# Patient Record
Sex: Female | Born: 2000 | Race: Black or African American | Hispanic: No | Marital: Single | Smoking: Never smoker
Health system: Southern US, Community
[De-identification: ages and names within clinical notes are randomized; demographics above are authoritative.]

## PROBLEM LIST (undated history)

## (undated) DIAGNOSIS — K5900 Constipation, unspecified: Secondary | ICD-10-CM

## (undated) DIAGNOSIS — R011 Cardiac murmur, unspecified: Secondary | ICD-10-CM

## (undated) DIAGNOSIS — D649 Anemia, unspecified: Secondary | ICD-10-CM

---
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
## 2021-01-17 IMAGING — CR DG CHEST 2V
1 series · 2 of 2 positions shown · non-contrast
Comparison: None.

CLINICAL DATA: Pt states chest pain and sob for 1X week, denies
cough. No hx of pneumothorax conditions or surgery. Non smoker.

EXAM:
CHEST - 2 VIEW

[Series 1: dg chest 2 view · 0.14mm/px · 2 of 2 slices shown]
[im 1/2]
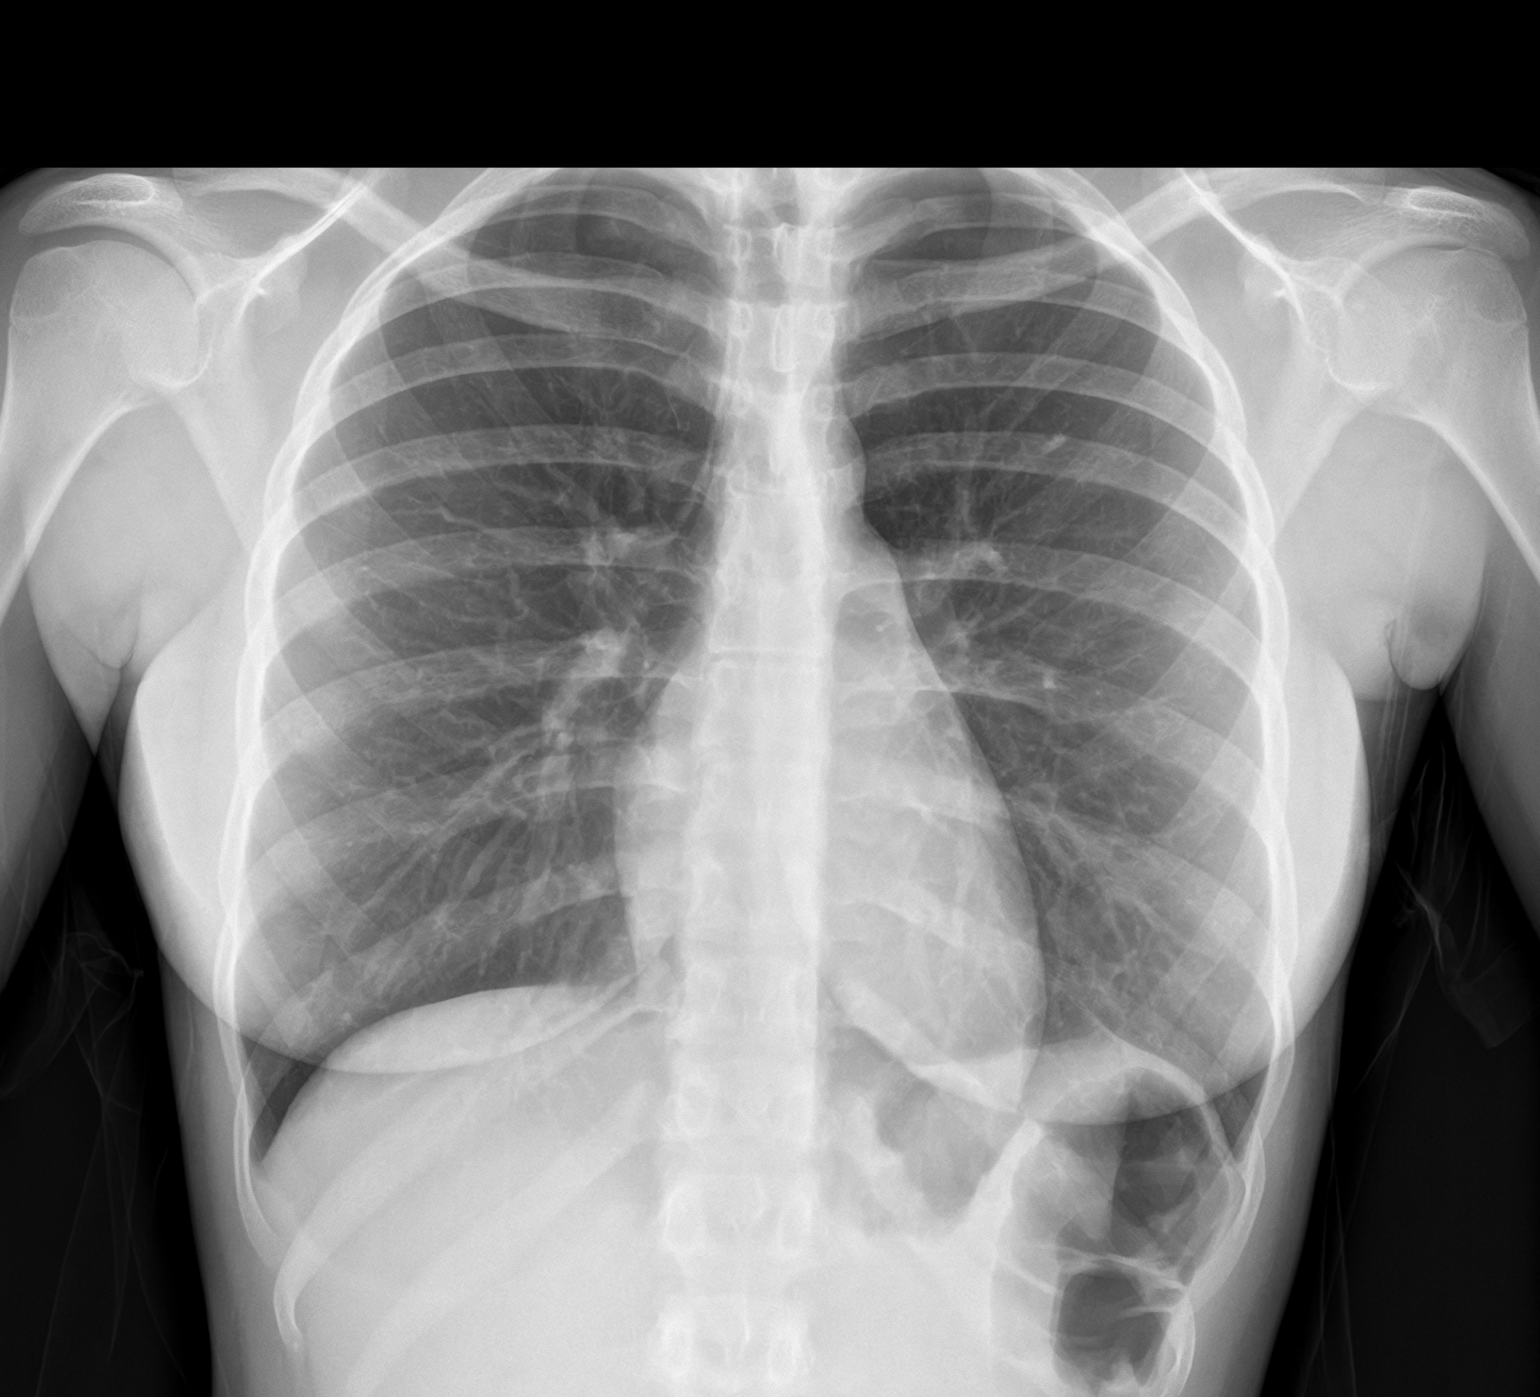
[im 2/2]
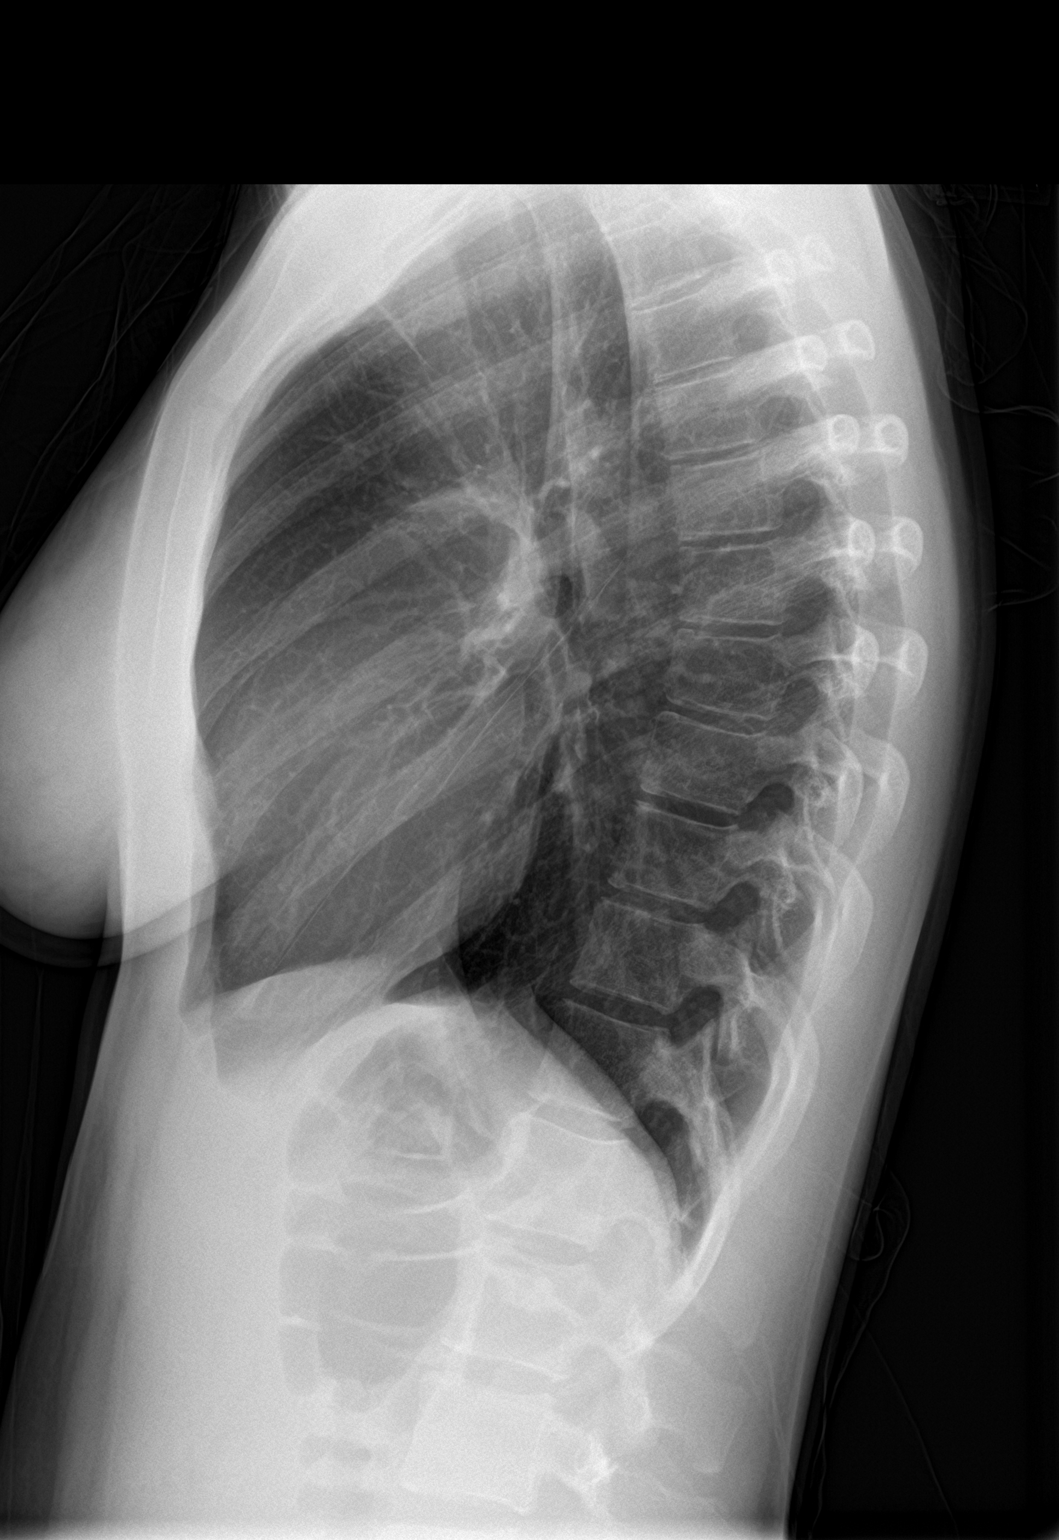

[2 of 2 positions shown; findings below may reference images not displayed]

FINDINGS: Normal heart, mediastinum and hila.

Clear lungs.

No pleural effusion or pneumothorax.

Skeletal structures within normal limits.
IMPRESSION: Normal chest radiographs.

## 2021-07-19 ENCOUNTER — Emergency Department
Admission: EM | Admit: 2021-07-19 | Discharge: 2021-07-19 | Discharge status: Against Medical Advice | Payer: Managed Care, Other (non HMO) | Attending: Emergency Medicine | Admitting: Emergency Medicine

## 2021-07-19 ENCOUNTER — Other Ambulatory Visit: Payer: Self-pay

## 2021-07-19 DIAGNOSIS — F329 Major depressive disorder, single episode, unspecified: Secondary | ICD-10-CM | POA: Insufficient documentation

## 2021-07-19 DIAGNOSIS — Z5321 Procedure and treatment not carried out due to patient leaving prior to being seen by health care provider: Secondary | ICD-10-CM | POA: Diagnosis not present

## 2021-07-19 NOTE — ED Triage Notes (Signed)
Pt presents to ER from home stating "my mind doesn't feel good."  Pt appears very somber in triage, not making contact with nursing staff.  Pt endorses feelings of depression and SI.  Denies HI at this time.  Pt has hx of anxiety, and was restarted on prozac appx 2 weeks ago.  Pt states thoughts of SI just started today.  Pt endorses stress in her life.  Pt otherwise A&O x4 at this time and in NAD.

## 2021-07-19 NOTE — ED Notes (Signed)
Pt and mother in triage room state that they do not want to go through the whole psych evaluation process, such as getting undressed and being in a room in the ER.  Pt states she is feeling sad, and is not really feeling suicidal.  Pt answered "NO" to all suicide screening questions.  Pt and mother state they will go see their PCP in [place] in the AM.  Pt ambulatory to triage at this time.

## 2021-10-23 IMAGING — US US ABDOMEN LIMITED RUQ/ASCITES
1 series · 14 of 25 positions shown · non-contrast
Comparison: None.

CLINICAL DATA: RUQ pain

EXAM:
ULTRASOUND ABDOMEN LIMITED RIGHT UPPER QUADRANT

[Series 1: us abdomen limited ruq (liver/gb) · 14 of 51 slices shown]
[im 1/51]
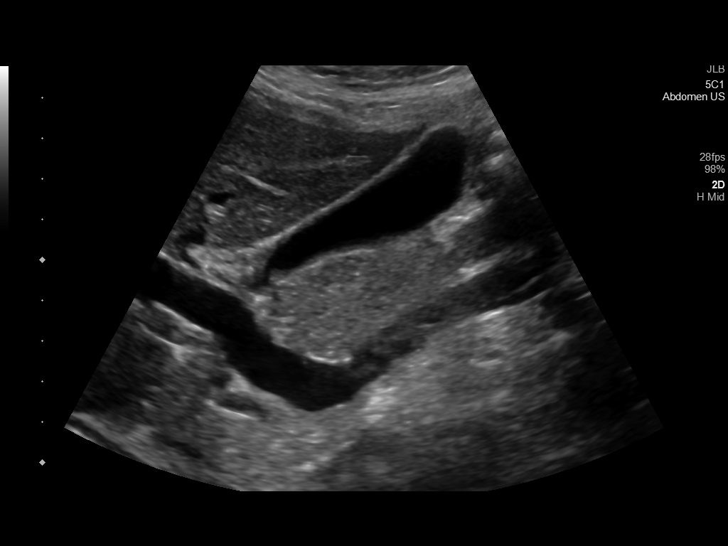
[im 5/51]
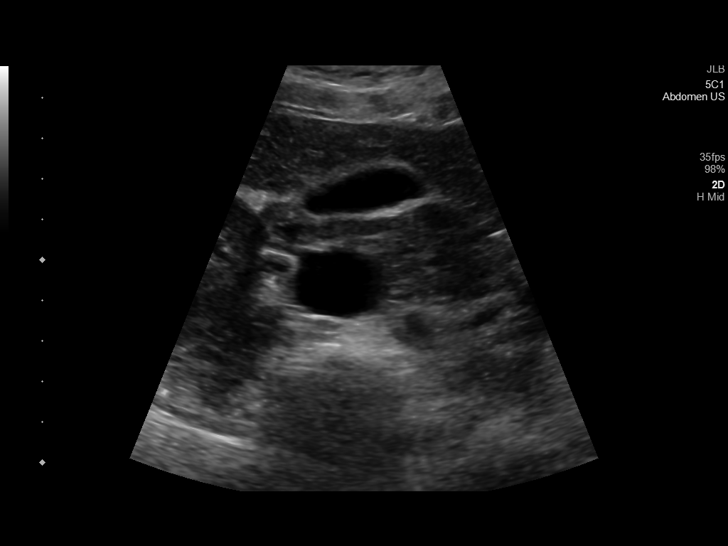
[im 9/51]
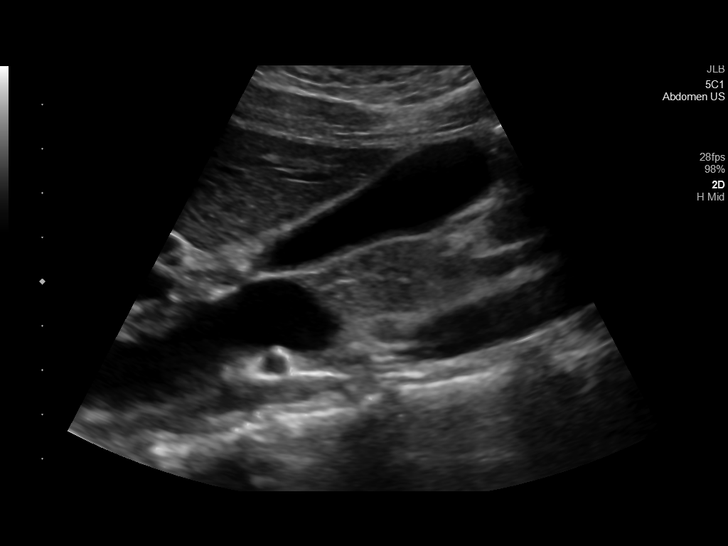
[im 13/51]
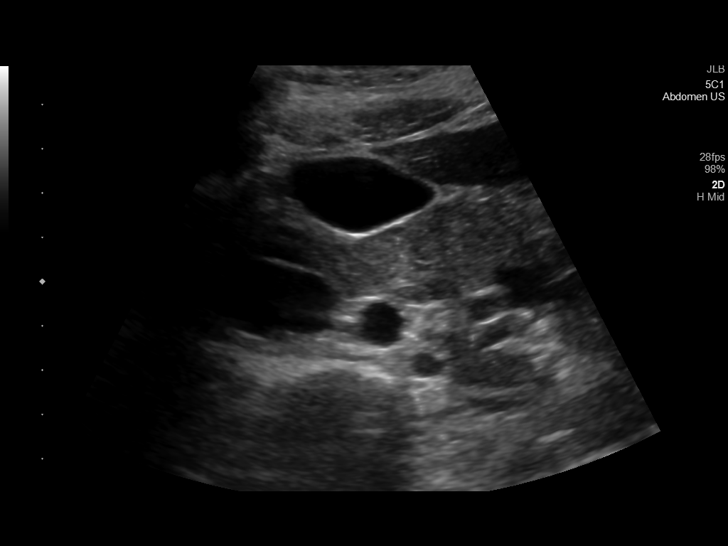
[im 17/51]
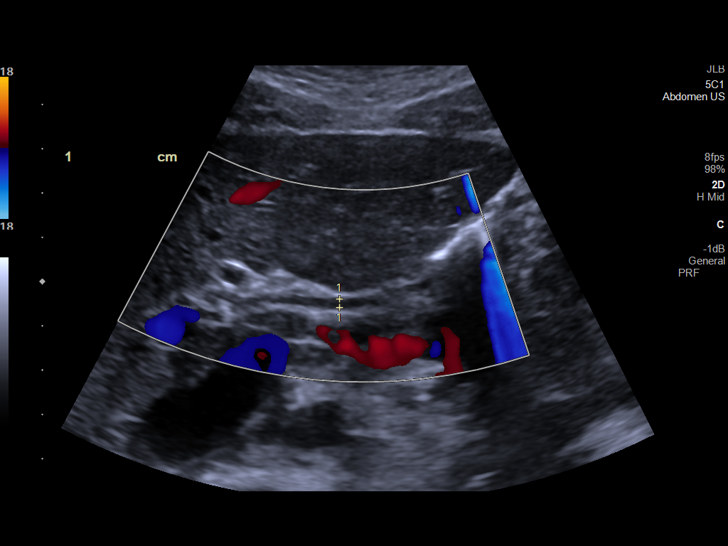
[im 19/51]
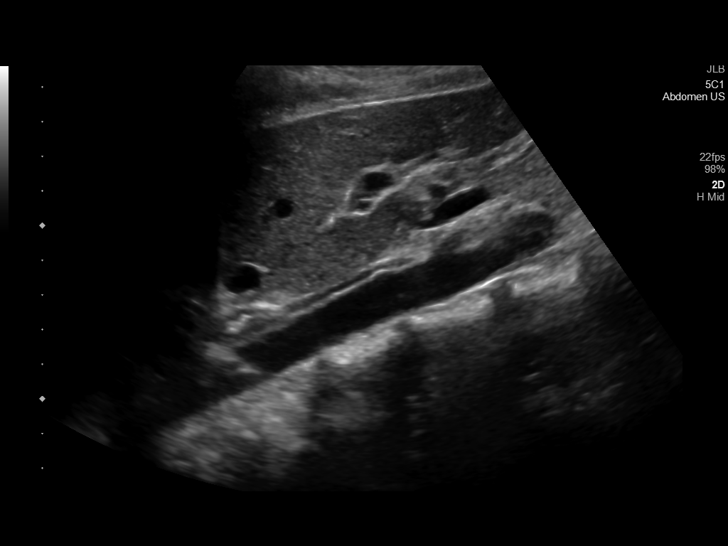
[im 23/51]
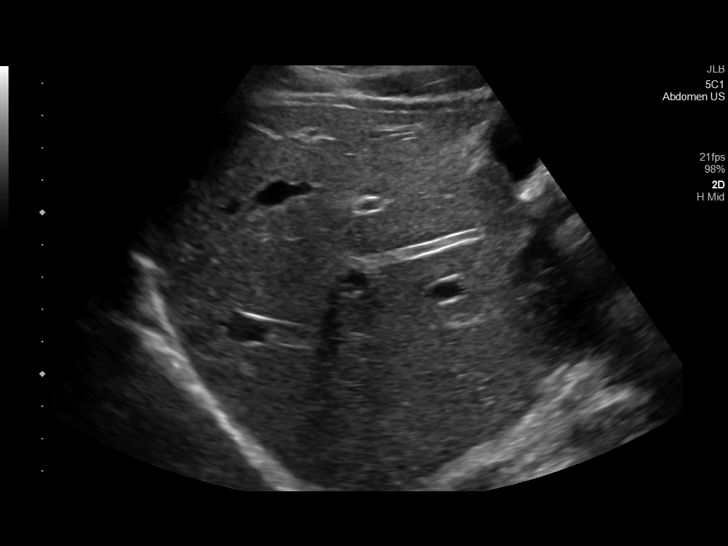
[im 28/51]
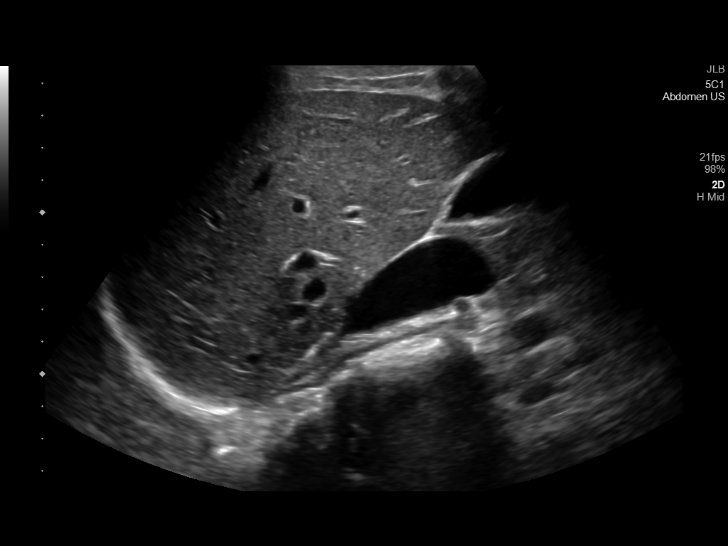
[im 32/51]
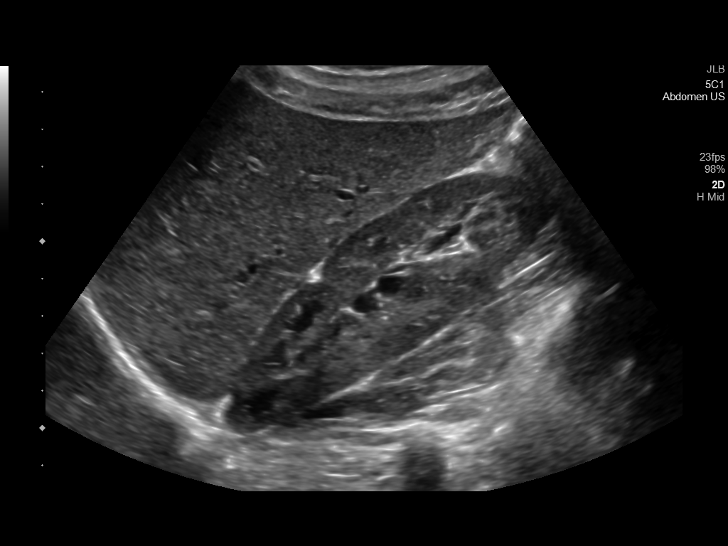
[im 34/51]
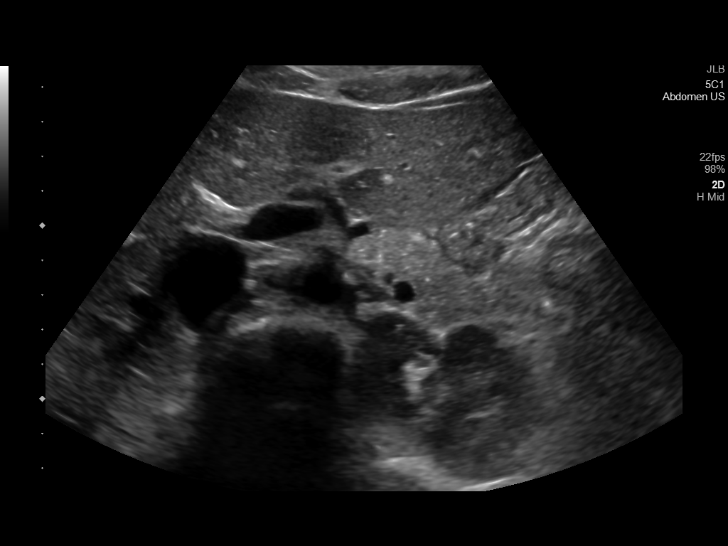
[im 38/51]
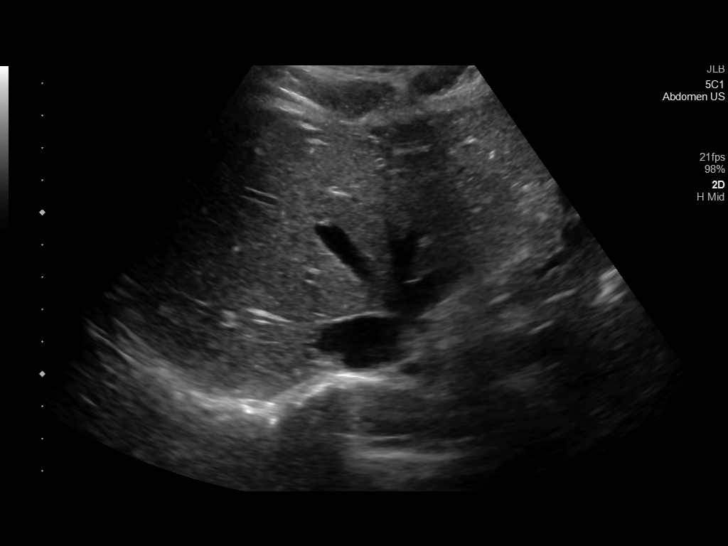
[im 42/51]
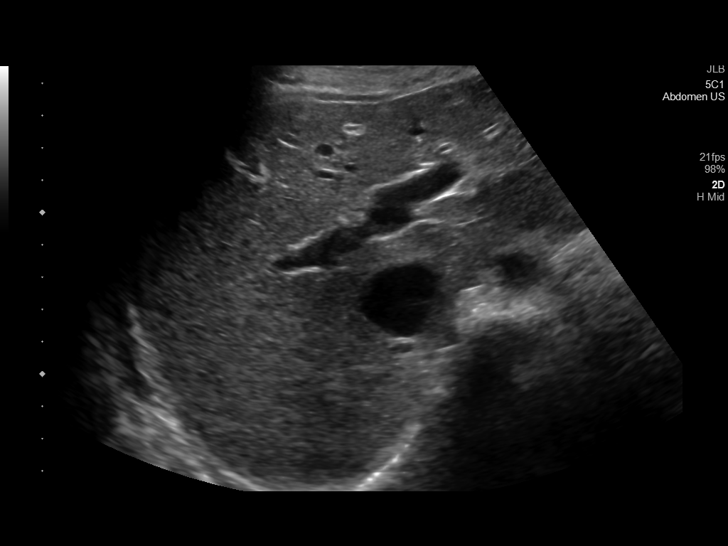
[im 46/51]
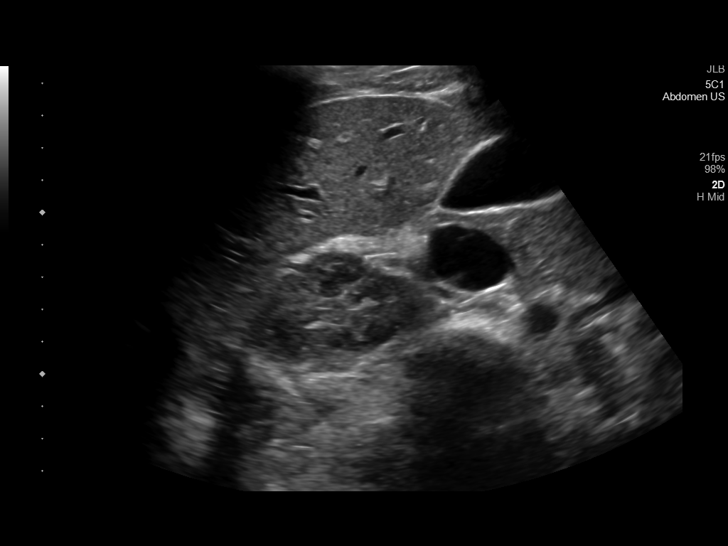
[im 51/51]
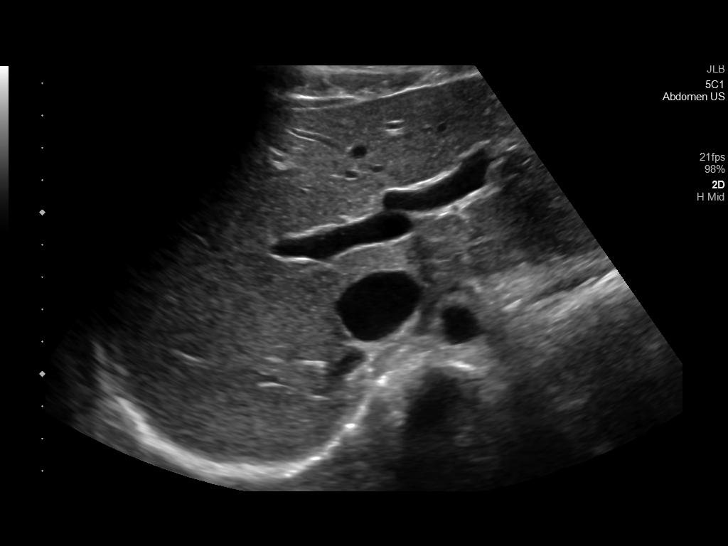

[14 of 25 positions shown; findings below may reference images not displayed]

FINDINGS: Gallbladder:

No gallstones or wall thickening visualized. No sonographic Murphy
sign noted by sonographer.

Common bile duct:

Diameter: 2mm

Liver:

No focal lesion identified. Within normal limits in parenchymal
echogenicity. Portal vein is patent on color Doppler imaging with
normal direction of blood flow towards the liver.

Other: None.
IMPRESSION: Normal examination

## 2022-01-26 ENCOUNTER — Emergency Department
Admission: EM | Admit: 2022-01-26 | Discharge: 2022-01-27 | Discharge status: Against Medical Advice | Payer: Managed Care, Other (non HMO) | Attending: Emergency Medicine | Admitting: Emergency Medicine

## 2022-01-26 DIAGNOSIS — R079 Chest pain, unspecified: Secondary | ICD-10-CM | POA: Diagnosis present

## 2022-01-26 DIAGNOSIS — R1110 Vomiting, unspecified: Secondary | ICD-10-CM | POA: Diagnosis not present

## 2022-01-26 DIAGNOSIS — R531 Weakness: Secondary | ICD-10-CM | POA: Diagnosis not present

## 2022-01-26 DIAGNOSIS — Z5321 Procedure and treatment not carried out due to patient leaving prior to being seen by health care provider: Secondary | ICD-10-CM | POA: Insufficient documentation

## 2022-01-27 DIAGNOSIS — R079 Chest pain, unspecified: Secondary | ICD-10-CM | POA: Diagnosis not present

## 2022-01-27 LAB — CBC
HCT: 34.2 % — ABNORMAL LOW (ref 36.0–46.0)
Hemoglobin: 10.8 g/dL — ABNORMAL LOW (ref 12.0–15.0)
MCH: 29 pg (ref 26.0–34.0)
MCHC: 31.6 g/dL (ref 30.0–36.0)
MCV: 91.9 fL (ref 80.0–100.0)
Platelets: 207 10*3/uL (ref 150–400)
RBC: 3.72 MIL/uL — ABNORMAL LOW (ref 3.87–5.11)
RDW: 14.2 % (ref 11.5–15.5)
WBC: 4.1 10*3/uL (ref 4.0–10.5)
nRBC: 0 % (ref 0.0–0.2)

## 2022-01-27 LAB — COMPREHENSIVE METABOLIC PANEL
ALT: 13 U/L (ref 0–44)
AST: 18 U/L (ref 15–41)
Albumin: 4 g/dL (ref 3.5–5.0)
Alkaline Phosphatase: 69 U/L (ref 38–126)
Anion gap: 5 (ref 5–15)
BUN: 10 mg/dL (ref 6–20)
CO2: 25 mmol/L (ref 22–32)
Calcium: 9 mg/dL (ref 8.9–10.3)
Chloride: 109 mmol/L (ref 98–111)
Creatinine, Ser: 0.74 mg/dL (ref 0.44–1.00)
GFR, Estimated: >60 mL/min (ref >60)
Glucose, Bld: 91 mg/dL (ref 70–99)
Potassium: 3.4 mmol/L — ABNORMAL LOW (ref 3.5–5.1)
Sodium: 139 mmol/L (ref 135–145)
Total Bilirubin: 0.9 mg/dL (ref 0.3–1.2)
Total Protein: 7.3 g/dL (ref 6.5–8.1)

## 2022-01-27 LAB — TROPONIN I (HIGH SENSITIVITY): Troponin I (High Sensitivity): <2 ng/L (ref <18)

## 2022-01-27 LAB — HCG, QUANTITATIVE, PREGNANCY: hCG, Beta Chain, Quant, S: <1 m[IU]/mL (ref <5)

## 2022-01-27 NOTE — ED Triage Notes (Signed)
Pt reports CP, weakness and vomiting, that started today. Denies SOB, diarrhea, or cold like symptoms. Afebrile, VSS, GCS 15. Mother reports has not eaten today, and is anemic, takes iron supplements.

## 2022-08-17 ENCOUNTER — Other Ambulatory Visit: Payer: Self-pay

## 2022-08-17 ENCOUNTER — Emergency Department: Payer: Managed Care, Other (non HMO)

## 2022-08-17 ENCOUNTER — Emergency Department
Admission: EM | Admit: 2022-08-17 | Discharge: 2022-08-17 | Disposition: A | Discharge status: Home / Self Care | Payer: Managed Care, Other (non HMO) | Attending: Student in an Organized Health Care Education/Training Program | Admitting: Student in an Organized Health Care Education/Training Program

## 2022-08-17 DIAGNOSIS — R079 Chest pain, unspecified: Secondary | ICD-10-CM | POA: Diagnosis present

## 2022-08-17 DIAGNOSIS — F439 Reaction to severe stress, unspecified: Secondary | ICD-10-CM

## 2022-08-17 DIAGNOSIS — F430 Acute stress reaction: Secondary | ICD-10-CM | POA: Insufficient documentation

## 2022-08-17 LAB — BASIC METABOLIC PANEL
Anion gap: 6 (ref 5–15)
BUN: 10 mg/dL (ref 6–20)
CO2: 25 mmol/L (ref 22–32)
Calcium: 9.5 mg/dL (ref 8.9–10.3)
Chloride: 107 mmol/L (ref 98–111)
Creatinine, Ser: 0.71 mg/dL (ref 0.44–1.00)
GFR, Estimated: >60 mL/min (ref >60)
Glucose, Bld: 84 mg/dL (ref 70–99)
Potassium: 3.6 mmol/L (ref 3.5–5.1)
Sodium: 138 mmol/L (ref 135–145)

## 2022-08-17 LAB — CBC
HCT: 33.2 % — ABNORMAL LOW (ref 36.0–46.0)
Hemoglobin: 10.8 g/dL — ABNORMAL LOW (ref 12.0–15.0)
MCH: 30.9 pg (ref 26.0–34.0)
MCHC: 32.5 g/dL (ref 30.0–36.0)
MCV: 94.9 fL (ref 80.0–100.0)
Platelets: 227 10*3/uL (ref 150–400)
RBC: 3.5 MIL/uL — ABNORMAL LOW (ref 3.87–5.11)
RDW: 13.9 % (ref 11.5–15.5)
WBC: 5.1 10*3/uL (ref 4.0–10.5)
nRBC: 0 % (ref 0.0–0.2)

## 2022-08-17 LAB — POC URINE PREG, ED: Preg Test, Ur: NEGATIVE

## 2022-08-17 LAB — TROPONIN I (HIGH SENSITIVITY): Troponin I (High Sensitivity): <2 ng/L (ref <18)

## 2022-08-17 MED ORDER — DIAZEPAM 5 MG PO TABS
5.0000 mg | ORAL_TABLET | Freq: Once | ORAL | Status: AC
  Administered 2022-08-17: 5 mg via ORAL
  Filled 2022-08-17: qty 1

## 2022-08-17 NOTE — ED Triage Notes (Addendum)
Pt to ED for chest pain for a couple months. States could not sleep last night. +shob with exertion Hx anemia

## 2022-08-17 NOTE — ED Provider Notes (Signed)
[facility] Provider Note    Event Date/Time   First MD Initiated Contact with Patient 08/17/22 1023     (approximate)   History   No chief complaint on file.   HPI  Judy Riddle is a 22 y.o. female with a history of anxiety presents to the ER for evaluation of chest pain shortness of breath started last night.  Has had somewhat similar episodes in the past but this 1 does feel different.  No fevers no cough.  She does vape.     Physical Exam   Triage Vital Signs: ED Triage Vitals  Enc Vitals Group     BP 08/17/22 1019 121/74     Pulse Rate 08/17/22 1019 70     Resp 08/17/22 1019 18     Temp 08/17/22 1019 98.1 F (36.7 C)     Temp src --      SpO2 08/17/22 1019 100 %     Weight 08/17/22 1020 108 lb (49 kg)     Height 08/17/22 1020 5\' 2"  (1.575 m)     Head Circumference --      Peak Flow --      Pain Score 08/17/22 1020 8     Pain Loc --      Pain Edu? --      Excl. in GC? --     Most recent vital signs: Vitals:   08/17/22 1019  BP: 121/74  Pulse: 70  Resp: 18  Temp: 98.1 F (36.7 C)  SpO2: 100%     Constitutional: Alert  Eyes: Conjunctivae are normal.  Head: Atraumatic. Nose: No congestion/rhinnorhea. Mouth/Throat: Mucous membranes are moist.   Neck: Painless ROM.  Cardiovascular:   Good peripheral circulation. Respiratory: Normal respiratory effort.  No retractions.  Gastrointestinal: Soft and nontender.  Musculoskeletal:  no deformity Neurologic:  MAE spontaneously. No gross focal neurologic deficits are appreciated.  Skin:  Skin is warm, dry and intact. No rash noted. Psychiatric: Mood and affect are anxious Speech and behavior are normal.    ED Results / Procedures / Treatments   Labs (all labs ordered are listed, but only abnormal results are displayed) Labs Reviewed  CBC - Abnormal; Notable for the following components:      Result Value   RBC 3.50 (*)    Hemoglobin 10.8 (*)    HCT 33.2 (*)    All other  components within normal limits  BASIC METABOLIC PANEL  POC URINE PREG, ED  TROPONIN I (HIGH SENSITIVITY)     EKG  ED ECG REPORT I, Willy Eddy, the attending physician, personally viewed and interpreted this ECG.   Date: 08/17/2022  EKG Time: 10:22  Rate: 65  Rhythm: sinus  Axis: normal  Intervals: normal  ST&T Change: no stemi,    RADIOLOGY Please see ED Course for my review and interpretation.  I personally reviewed all radiographic images ordered to evaluate for the above acute complaints and reviewed radiology reports and findings.  These findings were personally discussed with the patient.  Please see medical record for radiology report.    PROCEDURES:  Critical Care performed: No  Procedures   MEDICATIONS ORDERED IN ED: Medications  diazepam (VALIUM) tablet 5 mg (5 mg Oral Given 08/17/22 1050)     IMPRESSION / MDM / ASSESSMENT AND PLAN / ED COURSE  I reviewed the triage vital signs and the nursing notes.  Differential diagnosis includes, but is not limited to, ACS, pericarditis, esophagitis, boerhaaves, pe, dissection, pna, bronchitis, costochondritis  Patient presenting to the ER for evaluation of symptoms as described above.  Based on symptoms, risk factors and considered above differential, this presenting complaint could reflect a potentially life-threatening illness therefore the patient will be placed on continuous pulse oximetry and telemetry for monitoring.  Laboratory evaluation will be sent to evaluate for the above complaints.  She is low risk by Wells criteria she is PERC negative.  She is satting well on room air vital signs are stable.  Suspect anxiety or stress reaction.  Patient agrees to receive Valium to see if this makes her feel better.   Clinical Course as of 08/17/22 1134  Sun Aug 17, 2022  1053 Chest x-ray on my review and interpretation without evidence of consolidation or infiltrate. [PR]  1133  Patient reassessed.  Feels significantly improved.  Her troponin is less than 2.  This does seem most consistent with stress reaction.  She does appear stable and appropriate for outpatient follow-up. [PR]    Clinical Course User Index [PR] Willy Eddy, MD     FINAL CLINICAL IMPRESSION(S) / ED DIAGNOSES   Final diagnoses:  Stress     Rx / DC Orders   ED Discharge Orders     None        Note:  This document was prepared using Dragon voice recognition software and may include unintentional dictation errors.    Willy Eddy, MD 08/17/22 1134

## 2022-11-27 ENCOUNTER — Emergency Department
Admission: EM | Admit: 2022-11-27 | Discharge: 2022-11-27 | Disposition: A | Discharge status: Home / Self Care | Payer: Managed Care, Other (non HMO) | Attending: Emergency Medicine | Admitting: Emergency Medicine

## 2022-11-27 ENCOUNTER — Emergency Department: Payer: Managed Care, Other (non HMO)

## 2022-11-27 ENCOUNTER — Other Ambulatory Visit: Payer: Self-pay

## 2022-11-27 ENCOUNTER — Encounter: Payer: Self-pay | Admitting: Emergency Medicine

## 2022-11-27 DIAGNOSIS — G43809 Other migraine, not intractable, without status migrainosus: Secondary | ICD-10-CM | POA: Diagnosis not present

## 2022-11-27 DIAGNOSIS — R519 Headache, unspecified: Secondary | ICD-10-CM | POA: Diagnosis present

## 2022-11-27 MED ORDER — PROCHLORPERAZINE EDISYLATE 10 MG/2ML IJ SOLN
10.0000 mg | Freq: Once | INTRAMUSCULAR | Status: AC
  Administered 2022-11-27: 10 mg via INTRAVENOUS
  Filled 2022-11-27: qty 2

## 2022-11-27 MED ORDER — DIPHENHYDRAMINE HCL 50 MG/ML IJ SOLN
12.5000 mg | Freq: Once | INTRAMUSCULAR | Status: AC
  Administered 2022-11-27: 12.5 mg via INTRAVENOUS
  Filled 2022-11-27: qty 1

## 2022-11-27 MED ORDER — KETOROLAC TROMETHAMINE 15 MG/ML IJ SOLN
15.0000 mg | Freq: Once | INTRAMUSCULAR | Status: AC
  Administered 2022-11-27: 15 mg via INTRAVENOUS
  Filled 2022-11-27: qty 1

## 2022-11-27 MED ORDER — SODIUM CHLORIDE 0.9 % IV BOLUS
500.0000 mL | Freq: Once | INTRAVENOUS | Status: AC
  Administered 2022-11-27: 500 mL via INTRAVENOUS

## 2022-11-27 NOTE — ED Triage Notes (Signed)
Pt arrived via [facility] from home where pt called out for sudden onset of HA this AM @ 0300 while pt was watching TV. Pt reports she took 1 500mg  Tylenol with no relief and called EMS.

## 2022-11-27 NOTE — ED Notes (Signed)
Patient given discharge instructions including importance of follow up appt as needed with stated understanding. INT removed, cannula intact, pressure dressing applied. Patient stable and ambulatory with steady even gait with safe ride from mother.

## 2022-11-27 NOTE — Discharge Instructions (Signed)
Take acetaminophen 650 mg and ibuprofen 400 mg every 6 hours for pain.  Take with food.  Thank you for choosing us for your health care today!  Please see your primary doctor this week for a follow up appointment.   If you have any new, worsening, or unexpected symptoms call your doctor right away or come back to the emergency department for reevaluation.  It was my pleasure to care for you today.   Yarrow Linhart S. Kealan Buchan, MD  

## 2022-11-27 NOTE — ED Provider Notes (Signed)
[facility] Provider Note    Event Date/Time   First MD Initiated Contact with Patient 11/27/22 [id number]     (approximate)   History   Headache   HPI  Margee Trentham is a 22 y.o. female   Past medical history of migraine headaches, presents to the emergency department with a headache starting around 3 AM while watching TV.  Acute onset, right-sided headache.  Has not had a migraine headache for a long time.  Similar to prior migraine headaches.  No trauma, no recent illnesses, no other associated medical complaints.  She denies any motor changes or sensory changes, no visual changes.  No neck pain or stiffness.  Denies fevers or chills.  Took 1500 mg of Tylenol without immediate relief so called for EMS   External Medical Documents Reviewed: [facility] medical systems clinical summary denoting past medical history including generalized anxiety disorder, no other significant past medical history      Physical Exam   Triage Vital Signs: ED Triage Vitals [11/27/22 0413]  Encounter Vitals Group     BP 110/70     Systolic BP Percentile      Diastolic BP Percentile      Pulse Rate 92     Resp 18     Temp 98.2 F (36.8 C)     Temp Source Oral     SpO2 100 %     Weight      Height      Head Circumference      Peak Flow      Pain Score 10     Pain Loc      Pain Education      Exclude from Growth Chart     Most recent vital signs: Vitals:   11/27/22 0413  BP: 110/70  Pulse: 92  Resp: 18  Temp: 98.2 F (36.8 C)  SpO2: 100%    General: Awake, no distress.  CV:  Good peripheral perfusion.  Resp:  Normal effort.  Abd:  No distention.  Other:  Very comfortable appearing patient with normal vital signs and afebrile.  Pleasant cooperative, answering all questions appropriately, no motor or sensory deficits facial asymmetry dysarthria and neck is supple with full range of motion.   ED Results / Procedures / Treatments   Labs (all labs ordered are  listed, but only abnormal results are displayed) Labs Reviewed - No data to display   RADIOLOGY I independently reviewed and interpreted CT of the head see no obvious bleeding or midline shift I also reviewed radiologist's formal read.   PROCEDURES:  Critical Care performed: No  Procedures   MEDICATIONS ORDERED IN ED: Medications  sodium chloride 0.9 % bolus 500 mL (500 mLs Intravenous New Bag/Given 11/27/22 0434)  prochlorperazine (COMPAZINE) injection 10 mg (10 mg Intravenous Given 11/27/22 0436)  diphenhydrAMINE (BENADRYL) injection 12.5 mg (12.5 mg Intravenous Given 11/27/22 0437)   IMPRESSION / MDM / ASSESSMENT AND PLAN / ED COURSE  I reviewed the triage vital signs and the nursing notes.                                Patient's presentation is most consistent with acute presentation with potential threat to life or bodily function.  Differential diagnosis includes, but is not limited to, high-grade headache, tension type headache, cluster headache, considered but less likely ICH/SAH   The patient is on the cardiac monitor to evaluate for  evidence of arrhythmia and/or significant heart rate changes.  MDM:  \  Acute onset headache noted 8 out of 10 consistent with prior migraine headaches but none in the recent past.  No associated trauma, but given new onset headache severe sudden onset check CT head to rule out ICH/SAH.  Low clinical suspicion given her overall well appearance very comfortable appearing despite reporting severe headache.  Will give migraine cocktail.  If CT negative and some improvement with headache medication will discharge with PMD follow-up.         FINAL CLINICAL IMPRESSION(S) / ED DIAGNOSES   Final diagnoses:  Other migraine without status migrainosus, not intractable     Rx / DC Orders   ED Discharge Orders     None        Note:  This document was prepared using Dragon voice recognition software and may include unintentional  dictation errors.    Pilar Jarvis, MD 11/27/22 [phone]

## 2022-12-08 ENCOUNTER — Encounter: Payer: Self-pay | Admitting: *Deleted

## 2022-12-08 ENCOUNTER — Other Ambulatory Visit: Payer: Self-pay

## 2022-12-08 ENCOUNTER — Emergency Department
Admission: EM | Admit: 2022-12-08 | Discharge: 2022-12-08 | Disposition: A | Discharge status: Home / Self Care | Payer: Managed Care, Other (non HMO) | Attending: Emergency Medicine | Admitting: Emergency Medicine

## 2022-12-08 DIAGNOSIS — J039 Acute tonsillitis, unspecified: Secondary | ICD-10-CM | POA: Insufficient documentation

## 2022-12-08 DIAGNOSIS — J029 Acute pharyngitis, unspecified: Secondary | ICD-10-CM | POA: Diagnosis present

## 2022-12-08 DIAGNOSIS — Z20822 Contact with and (suspected) exposure to covid-19: Secondary | ICD-10-CM | POA: Insufficient documentation

## 2022-12-08 LAB — RESP PANEL BY RT-PCR (RSV, FLU A&B, COVID)  RVPGX2
Influenza A by PCR: NEGATIVE
Influenza B by PCR: NEGATIVE
Resp Syncytial Virus by PCR: NEGATIVE
SARS Coronavirus 2 by RT PCR: NEGATIVE

## 2022-12-08 LAB — GROUP A STREP BY PCR: Group A Strep by PCR: NOT DETECTED

## 2022-12-08 MED ORDER — AMOXICILLIN 875 MG PO TABS: 875.0000 mg | ORAL_TABLET | Freq: Two times a day (BID) | ORAL | 0 refills | Status: AC

## 2022-12-08 MED ORDER — BENZONATATE 100 MG PO CAPS: 100.0000 mg | ORAL_CAPSULE | Freq: Three times a day (TID) | ORAL | 0 refills | Status: AC | PRN

## 2022-12-08 NOTE — ED Triage Notes (Signed)
Pt is here for evaluation of sore throat and URI with body aches for a week.  Pt is also having coughing with this and reports rib soreness when coughing.

## 2022-12-08 NOTE — ED Provider Notes (Signed)
[facility] Emergency Department Provider Note     Event Date/Time   First MD Initiated Contact with Patient 12/08/22 1758     (approximate)   History   Sore Throat   HPI  Judy Riddle is a 22 y.o. female presents to the ED with complaint of sore throat and cough x 1 week.  Patient reports she is having chest wall pain and back pain due the amount of coughing.  She denies fever, shortness of breath, vomiting and sick contacts.  Patient has taken Tylenol with minimal relief.  Patient reports history of strep throat.    Physical Exam   Triage Vital Signs: ED Triage Vitals  Encounter Vitals Group     BP 12/08/22 1701 100/65     Systolic BP Percentile --      Diastolic BP Percentile --      Pulse Rate 12/08/22 1701 81     Resp 12/08/22 1701 14     Temp 12/08/22 1701 98.2 F (36.8 C)     Temp src --      SpO2 12/08/22 1701 100 %     Weight --      Height --      Head Circumference --      Peak Flow --      Pain Score 12/08/22 1700 8     Pain Loc --      Pain Education --      Exclude from Growth Chart --     Most recent vital signs: Vitals:   12/08/22 1701  BP: 100/65  Pulse: 81  Resp: 14  Temp: 98.2 F (36.8 C)  SpO2: 100%    General: Well appearing. Alert and oriented. INAD.  Skin:  Warm, dry and intact. No rashes or lesions noted.     Head:  NCAT.  Eyes:  PERRLA. EOMI.  Nose:   Mucosa is moist. No rhinorrhea. Throat: Oropharynx is mildly erythremic. Exudates noted on left tonsil. Tonsils mildly enlarged.  Airway is patent.  Uvula is midline. CV:  Good peripheral perfusion. RRR.  RESP:  Normal effort. LCTAB.  No wheezing, crackles or rhonchi.  No retractions.  ABD:  No distention. Soft. NEURO: Cranial nerves intact. No focal deficits.   ED Results / Procedures / Treatments   Labs (all labs ordered are listed, but only abnormal results are displayed) Labs Reviewed  GROUP A STREP BY PCR  RESP PANEL BY RT-PCR (RSV, FLU A&B,  COVID)  [id number]   No results found.  PROCEDURES:  Critical Care performed: No  Procedures  MEDICATIONS ORDERED IN ED: Medications - No data to display  IMPRESSION / MDM / ASSESSMENT AND PLAN / ED COURSE  I reviewed the triage vital signs and the nursing notes.                               22 y.o. female presents to the emergency department for evaluation and treatment of sore throat. See HPI for further details.  Differential diagnosis includes, but is not limited to pharyngitis, viral pharyngitis, mononucleosis, viral URI, costochondritis  Patient's presentation is most consistent with acute complicated illness / injury requiring diagnostic workup.  Patient is alert and oriented.  She is hemodynamically stable.  She is afebrile.  Physical exam findings are pertinent for a mildly erythemic oropharynx and a singular exudate on the left tonsil.  Airway is patent and patient is speaking in  complete and normal speech.  Pittore panel and strep test is negative however given physical exam findings and history of strep pharyngitis I will go ahead and treat with antibiotics.  Patient is in stable condition for discharge home.  She is encouraged to follow-up with her primary care as needed.  ED precautions are discussed.  FINAL CLINICAL IMPRESSION(S) / ED DIAGNOSES   Final diagnoses:  Sore throat  Acute tonsillitis, unspecified etiology   Rx / DC Orders   ED Discharge Orders          Ordered    amoxicillin (AMOXIL) 875 MG tablet  2 times daily        12/08/22 1817    benzonatate (TESSALON PERLES) 100 MG capsule  3 times daily PRN        12/08/22 1817           Note:  This document was prepared using Dragon voice recognition software and may include unintentional dictation errors.    Romeo Apple, Rashad Auld A, PA-C 12/08/22 Pamelia Hoit, MD 12/08/22 2329

## 2022-12-08 NOTE — Discharge Instructions (Addendum)
Your evaluated in the ED for sore throat and cough.  Your respiratory panel which includes COVID, influenza and RSV is negative.  Your group strep is negative.  Your throat exam did show some exudates which is concerning for possible strep pharyngitis.  Pick up antibiotics and take until dosage is complete.  Take ibuprofen for pain as needed.  Place heating pad over chest and back as needed to help with tightness.  Follow-up with your primary care.

## 2022-12-09 ENCOUNTER — Emergency Department
Admission: EM | Admit: 2022-12-09 | Discharge: 2022-12-09 | Disposition: A | Discharge status: Home / Self Care | Payer: Managed Care, Other (non HMO) | Attending: Emergency Medicine | Admitting: Emergency Medicine

## 2022-12-09 DIAGNOSIS — R202 Paresthesia of skin: Secondary | ICD-10-CM | POA: Insufficient documentation

## 2022-12-09 NOTE — ED Triage Notes (Signed)
Pt to ED for intermittent tingling in left leg for a few days. Reports decrease in sx with heat. Was seen yesterday. NAD Noted Ambulatory

## 2022-12-09 NOTE — ED Provider Notes (Signed)
[facility] Provider Note    Event Date/Time   First MD Initiated Contact with Patient 12/09/22 1518     (approximate)   History   Tingling   HPI  Judy Riddle is a 22 y.o. female for about 3 days has had a couple episodes of numbness from about the left knee down the left foot.  Happens while riding in the car for a while.  Goes away after she gets up stretches or walks  No difficulty with movement.  Seems to get better soon as she repositions.  Also reports that she is doing better with perspective to the sore throat, she has been completing antibiotic.  No trouble speaking no headache no nausea or vomiting.  Currently no symptoms.  Asymptomatic.     Physical Exam   Triage Vital Signs: ED Triage Vitals  Encounter Vitals Group     BP 12/09/22 1516 106/64     Systolic BP Percentile --      Diastolic BP Percentile --      Pulse Rate 12/09/22 1516 92     Resp 12/09/22 1516 14     Temp 12/09/22 1516 98.3 F (36.8 C)     Temp src --      SpO2 12/09/22 1516 96 %     Weight 12/09/22 1516 110 lb (49.9 kg)     Height 12/09/22 1516 5\' 2"  (1.575 m)     Head Circumference --      Peak Flow --      Pain Score 12/09/22 1515 0     Pain Loc --      Pain Education --      Exclude from Growth Chart --     Most recent vital signs: Vitals:   12/09/22 1516  BP: 106/64  Pulse: 92  Resp: 14  Temp: 98.3 F (36.8 C)  SpO2: 96%     General: Awake, no distress.  CV:  Good peripheral perfusion.  Resp:  Normal effort.  Abd:  No distention.  Other:  Strong palpable dorsalis pedis and posterior tibial in both foot.  Normal motor and sensory over the right foot ankle dorsi plantarflex toe wiggle, and knee and hips.  She is able to ambulate with normal gait.  Advises no active symptoms.  Denies pain to palpation of lower back.  No changes in bowel or bladder.  The pain does not radiate from her back and the numbness is not involve the entire leg when it does  occur it gets better when she stands up and walks around   ED Results / Procedures / Treatments   Labs (all labs ordered are listed, but only abnormal results are displayed) Labs Reviewed - No data to display   EKG     RADIOLOGY  Prior CT scan of the head less than 1 month ago was normal   PROCEDURES:  Critical Care performed: No  Procedures   MEDICATIONS ORDERED IN ED: Medications - No data to display   IMPRESSION / MDM / ASSESSMENT AND PLAN / ED COURSE  I reviewed the triage vital signs and the nursing notes.                              Differential diagnosis includes, but is not limited to, paresthesia, nerve compression, neuropathy, vascular abnormality, etc.  She is currently asymptomatic normal gait.  Full and clear speech normal strength.  No pronator drift.  Normal extraocular movements.  Normal facial expressions.  She describes situational show developed a paresthesia tingling feeling from about the left knee down while riding in the car.  He gets better when she gets up and walks around.  Suspect likely some element of nerve compression occurring secondary to positioning.  She is currently asymptomatic.  Patient's presentation is most consistent with acute, uncomplicated illness.   No signs or symptoms of be suggestive of developing sepsis, worsening of previous pharyngitis, central neurologic cause, stroke, multiple sclerosis etc.  She appears appropriate for ongoing outpatient workup.  Careful return precautions discussed with patient  Return precautions and treatment recommendations and follow-up discussed with the patient who is agreeable with the plan.        FINAL CLINICAL IMPRESSION(S) / ED DIAGNOSES   Final diagnoses:  Paresthesia of left leg     Rx / DC Orders   ED Discharge Orders     None        Note:  This document was prepared using Dragon voice recognition software and may include unintentional dictation errors.   Sharyn Creamer, MD 12/09/22 1530

## 2023-04-09 ENCOUNTER — Other Ambulatory Visit: Payer: Self-pay

## 2023-04-09 ENCOUNTER — Emergency Department
Admission: EM | Admit: 2023-04-09 | Discharge: 2023-04-09 | Disposition: A | Discharge status: Home / Self Care | Payer: Managed Care, Other (non HMO) | Attending: Student in an Organized Health Care Education/Training Program | Admitting: Student in an Organized Health Care Education/Training Program

## 2023-04-09 DIAGNOSIS — R112 Nausea with vomiting, unspecified: Secondary | ICD-10-CM | POA: Insufficient documentation

## 2023-04-09 DIAGNOSIS — R1110 Vomiting, unspecified: Secondary | ICD-10-CM | POA: Diagnosis present

## 2023-04-09 LAB — URINALYSIS, ROUTINE W REFLEX MICROSCOPIC
Bilirubin Urine: NEGATIVE
Glucose, UA: NEGATIVE mg/dL
Hgb urine dipstick: NEGATIVE
Ketones, ur: NEGATIVE mg/dL
Leukocytes,Ua: NEGATIVE
Nitrite: NEGATIVE
Protein, ur: NEGATIVE mg/dL
Specific Gravity, Urine: 1.025 (ref 1.005–1.030)
pH: 5 (ref 5.0–8.0)

## 2023-04-09 LAB — COMPREHENSIVE METABOLIC PANEL
ALT: 16 U/L (ref 0–44)
AST: 19 U/L (ref 15–41)
Albumin: 4.2 g/dL (ref 3.5–5.0)
Alkaline Phosphatase: 59 U/L (ref 38–126)
Anion gap: 9 (ref 5–15)
BUN: 13 mg/dL (ref 6–20)
CO2: 24 mmol/L (ref 22–32)
Calcium: 9.1 mg/dL (ref 8.9–10.3)
Chloride: 107 mmol/L (ref 98–111)
Creatinine, Ser: 0.71 mg/dL (ref 0.44–1.00)
GFR, Estimated: >60 mL/min (ref >60)
Glucose, Bld: 107 mg/dL — ABNORMAL HIGH (ref 70–99)
Potassium: 3.4 mmol/L — ABNORMAL LOW (ref 3.5–5.1)
Sodium: 140 mmol/L (ref 135–145)
Total Bilirubin: 1.2 mg/dL (ref 0.0–1.2)
Total Protein: 7.6 g/dL (ref 6.5–8.1)

## 2023-04-09 LAB — CBC
HCT: 37 % (ref 36.0–46.0)
Hemoglobin: 12.4 g/dL (ref 12.0–15.0)
MCH: 31.6 pg (ref 26.0–34.0)
MCHC: 33.5 g/dL (ref 30.0–36.0)
MCV: 94.1 fL (ref 80.0–100.0)
Platelets: 217 10*3/uL (ref 150–400)
RBC: 3.93 MIL/uL (ref 3.87–5.11)
RDW: 13.3 % (ref 11.5–15.5)
WBC: 7.2 10*3/uL (ref 4.0–10.5)
nRBC: 0 % (ref 0.0–0.2)

## 2023-04-09 LAB — POC URINE PREG, ED: Preg Test, Ur: NEGATIVE

## 2023-04-09 LAB — LIPASE, BLOOD: Lipase: 31 U/L (ref 11–51)

## 2023-04-09 MED ORDER — ONDANSETRON 4 MG PO TBDP
4.0000 mg | ORAL_TABLET | Freq: Once | ORAL | Status: AC
  Administered 2023-04-09: 4 mg via ORAL
  Filled 2023-04-09: qty 1

## 2023-04-09 MED ORDER — ONDANSETRON 4 MG PO TBDP: 4.0000 mg | ORAL_TABLET | Freq: Three times a day (TID) | ORAL | 0 refills | Status: DC | PRN

## 2023-04-09 NOTE — ED Provider Notes (Signed)
[facility] Provider Note    Event Date/Time   First MD Initiated Contact with Patient 04/09/23 1006     (approximate)   History   Emesis   HPI  Judy Riddle is a 23 y.o. female   presents to the ED with complaint of vomiting since last evening.  Patient last vomited approximately 20 to 30 minutes ago.  She denies any fever, chills or diarrhea.  Sister is here with patient and mom is on speaker phone in the room to ask questions.      Physical Exam   Triage Vital Signs: ED Triage Vitals [04/09/23 0834]  Encounter Vitals Group     BP 105/77     Systolic BP Percentile      Diastolic BP Percentile      Pulse Rate (!) 128     Resp 17     Temp 98.6 F (37 C)     Temp Source Oral     SpO2 100 %     Weight 110 lb 0.2 oz (49.9 kg)     Height 5\' 2"  (1.575 m)     Head Circumference      Peak Flow      Pain Score 7     Pain Loc      Pain Education      Exclude from Growth Chart     Most recent vital signs: Vitals:   04/09/23 0834 04/09/23 1203  BP: 105/77 (!) 92/54  Pulse: (!) 128 100  Resp: 17 17  Temp: 98.6 F (37 C)   SpO2: 100% 99%     General: Awake, no distress.  CV:  Good peripheral perfusion.  Resp:  Normal effort.  Lungs are clear bilaterally. Abd:  No distention.  Soft, flat, nontender palpation.  Bowel sounds are normoactive present Other:     ED Results / Procedures / Treatments   Labs (all labs ordered are listed, but only abnormal results are displayed) Labs Reviewed  COMPREHENSIVE METABOLIC PANEL - Abnormal; Notable for the following components:      Result Value   Potassium 3.4 (*)    Glucose, Bld 107 (*)    All other components within normal limits  URINALYSIS, ROUTINE W REFLEX MICROSCOPIC - Abnormal; Notable for the following components:   Color, Urine YELLOW (*)    APPearance HAZY (*)    All other components within normal limits  LIPASE, BLOOD  CBC  POC URINE PREG, ED     PROCEDURES:  Critical Care  performed:   Procedures   MEDICATIONS ORDERED IN ED: Medications  ondansetron (ZOFRAN-ODT) disintegrating tablet 4 mg (4 mg Oral Given 04/09/23 1019)   ----------------------------------------- 10:51 AM on 04/09/2023 ----------------------------------------- Patient voices improvement.  P.o. challenge at this time with ice chips.  IMPRESSION / MDM / ASSESSMENT AND PLAN / ED COURSE  I reviewed the triage vital signs and the nursing notes.   Differential diagnosis includes, but is not limited to, gastritis, norovirus, viral, illness, influenza, urinary tract infection considered, pregnancy.  23 year old female presents to the ED with complaint of vomiting that began last evening and is continued.  Patient lab work was reassuring and patient was made aware along with discussion with the mother who is on speaker phone.  Zofran was given p.o. while in the ED.  She tolerated ice chips and a popsicle prior to discharge.  A prescription for Zofran was sent to the pharmacy for her to continue using every 8 hours as needed.  She is instructed to stay on clear liquids today.  She is aware that this is contagious.  Tylenol if needed and to follow-up with her PCP if any continued problems.      Patient's presentation is most consistent with acute illness / injury with system symptoms.  FINAL CLINICAL IMPRESSION(S) / ED DIAGNOSES   Final diagnoses:  Nausea and vomiting, unspecified vomiting type     Rx / DC Orders   ED Discharge Orders          Ordered    ondansetron (ZOFRAN-ODT) 4 MG disintegrating tablet  Every 8 hours PRN        04/09/23 1158             Note:  This document was prepared using Dragon voice recognition software and may include unintentional dictation errors.   Tommi Rumps, PA-C 04/09/23 1220    Willy Eddy, MD 04/09/23 1450

## 2023-04-09 NOTE — ED Triage Notes (Signed)
Pt here with emesis since 2300 last night. Pt states abd pain is generalized. Pt also c/o lower back pain.

## 2023-04-09 NOTE — Discharge Instructions (Signed)
Follow-up with your primary care provider if any continued problems.  Clear liquids for the remainder of the day.  A prescription for Zofran was sent to the pharmacy for you to begin using every 8 hours as needed for nausea.  You may have clear liquids such as ginger ale, Sprite, 7-Up, Gatorade and popsicles.  Tylenol if needed for any fever, headache or bodyaches.

## 2023-05-25 ENCOUNTER — Ambulatory Visit: Admission: EM | Admit: 2023-05-25 | Discharge: 2023-05-25 | Disposition: A | Discharge status: Home / Self Care

## 2023-05-25 DIAGNOSIS — R5383 Other fatigue: Secondary | ICD-10-CM

## 2023-05-25 NOTE — Discharge Instructions (Signed)
Follow up with your primary care provider tomorrow.  Go to the emergency department if you have worsening symptoms.

## 2023-05-25 NOTE — ED Triage Notes (Signed)
 Patient to Urgent Care with complaints of "brain fog" and weakness. Poor sleep. Good oral intake.  Symptoms started 2-3 days ago. Denies any other symptoms.

## 2023-05-25 NOTE — ED Provider Notes (Signed)
 Renaldo Fiddler    CSN: [id number] Arrival date & time: 05/25/23  1700      History   Chief Complaint Chief Complaint  Patient presents with   Brain Fog    HPI Judy Riddle is a 23 y.o. female.  Patient presents with 2-day history of "brain fog" and fatigue.  No trauma.  She denies fever, chills, earache, sore throat, cough, chest pain, shortness of breath, abdominal pain, vomiting, diarrhea, dizziness, focal weakness, numbness, vision change.  No OTC medication taken today.   The history is provided by the patient and medical records.    Past Medical History:  Diagnosis Date   Anemia    Constipation    Heart murmur     There are no active problems to display for this patient.   History reviewed. No pertinent surgical history.  OB History   No obstetric history on file.      Home Medications    Prior to Admission medications   Medication Sig Start Date End Date Taking? Authorizing Provider  ferrous sulfate 325 (65 FE) MG EC tablet Take 325 mg by mouth 3 (three) times daily with meals.   Yes [provider]  FLUoxetine (PROZAC) 40 MG capsule Take 40 mg by mouth daily.   Yes [provider]  vitamin B-12 (CYANOCOBALAMIN) 100 MCG tablet Take 100 mcg by mouth daily.   Yes [provider]  amoxicillin (AMOXIL) 875 MG tablet Take 1 tablet (875 mg total) by mouth 2 (two) times daily. Patient not taking: Reported on 04/09/2023 12/08/22   Kern Reap A, PA-C  benzonatate (TESSALON PERLES) 100 MG capsule Take 1 capsule (100 mg total) by mouth 3 (three) times daily as needed for cough. Patient not taking: Reported on 04/09/2023 12/08/22 12/08/23  Kern Reap A, PA-C  ondansetron (ZOFRAN-ODT) 4 MG disintegrating tablet Take 1 tablet (4 mg total) by mouth every 8 (eight) hours as needed for nausea or vomiting. Patient not taking: Reported on 05/25/2023 04/09/23   Eather Colas    Family History History reviewed. No pertinent  family history.  Social History Social History   Tobacco Use   Smoking status: Never   Smokeless tobacco: Never  Vaping Use   Vaping status: Former  Substance Use Topics   Alcohol use: No   Drug use: Not Currently     Allergies   Patient has no known allergies.   Review of Systems Review of Systems  Constitutional:  Positive for fatigue. Negative for chills and fever.  HENT:  Negative for ear pain and sore throat.   Respiratory:  Negative for cough and shortness of breath.   Cardiovascular:  Negative for chest pain and palpitations.  Gastrointestinal:  Negative for abdominal pain, diarrhea and vomiting.  Neurological:  Negative for dizziness, syncope, facial asymmetry, weakness, numbness and headaches.     Physical Exam Triage Vital Signs ED Triage Vitals [05/25/23 1708]  Encounter Vitals Group     BP 97/64     Systolic BP Percentile      Diastolic BP Percentile      Pulse Rate 80     Resp 18     Temp 97.7 F (36.5 C)     Temp src      SpO2 98 %     Weight      Height      Head Circumference      Peak Flow      Pain Score  Pain Loc      Pain Education      Exclude from Growth Chart    No data found.  Updated Vital Signs BP 97/64   Pulse 80   Temp 97.7 F (36.5 C)   Resp 18   LMP 05/20/2023   SpO2 98%   Visual Acuity Right Eye Distance:   Left Eye Distance:   Bilateral Distance:    Right Eye Near:   Left Eye Near:    Bilateral Near:     Physical Exam Constitutional:      General: She is not in acute distress. HENT:     Right Ear: Tympanic membrane normal.     Left Ear: Tympanic membrane normal.     Nose: Nose normal.     Mouth/Throat:     Mouth: Mucous membranes are moist.     Pharynx: Oropharynx is clear.  Eyes:     Pupils: Pupils are equal, round, and reactive to light.  Cardiovascular:     Rate and Rhythm: Normal rate and regular rhythm.     Heart sounds: Normal heart sounds.  Pulmonary:     Effort: Pulmonary effort is  normal. No respiratory distress.     Breath sounds: Normal breath sounds.  Abdominal:     General: Bowel sounds are normal.     Palpations: Abdomen is soft.     Tenderness: There is no abdominal tenderness. There is no guarding or rebound.  Skin:    General: Skin is warm and dry.  Neurological:     General: No focal deficit present.     Mental Status: She is alert and oriented to person, place, and time.     Sensory: No sensory deficit.     Motor: No weakness.     Gait: Gait normal.     Comments: Patient is alert, oriented to person place time and situation.      UC Treatments / Results  Labs (all labs ordered are listed, but only abnormal results are displayed) Labs Reviewed - No data to display  EKG   Radiology No results found.  Procedures Procedures (including critical care time)  Medications Ordered in UC Medications - No data to display  Initial Impression / Assessment and Plan / UC Course  I have reviewed the triage vital signs and the nursing notes.  Pertinent labs & imaging results that were available during my care of the patient were reviewed by me and considered in my medical decision making (see chart for details).   Fatigue.  No trauma.  She is alert and oriented.  Afebrile and vital signs are stable.  Patient is well-appearing and her exam is reassuring.  Instructed her to follow-up with her PCP tomorrow.  ED precautions given.  Education provided on fatigue.  She agrees to plan of care.  Final Clinical Impressions(s) / UC Diagnoses   Final diagnoses:  Fatigue, unspecified type     Discharge Instructions      Follow up with your primary care provider tomorrow.  Go to the emergency department if you have worsening symptoms.        ED Prescriptions   None    PDMP not reviewed this encounter.   Mickie Bail, NP 05/25/23 [phone]

## 2023-06-05 NOTE — Progress Notes (Signed)
 [facility] at [facility]   Reason for visit: Establish Care  Questions / Concerns that need to be addressed: Small bump under  r arm   Allergies reviewed: Yes  Medication reviewed: Yes Pended refills? No    HCDM reviewed and updated in Epic:   We are working to make sure all of our patients' wishes are updated in Epic and part of that is documenting a Environmental health practitioner for each patient A Health Care Decision Willene is someone you choose who can make health care decisions for you if you are not able - who would you most want to do this for you?" is already up to date.    BPAs completed: PHQ2 PHQ9 GAD7 AUDIT - Alcohol Screen HARK - Interpersonal Violence P4   COVID-19 Vaccine Summary Which COVID-19 Vaccine was administered Type: Dates Given:            If no: Are you interested in scheduling? Declines vaccine  Immunization History  Administered Date(s) Administered  . DTaP 02/02/2002, 05/12/2002, 12/06/2002, 06/01/2006  . HEPATITIS B VACCINE ADULT,IM(ENERGIX B, RECOMBIVAX) 06-18-01, 02/02/2002, 05/12/2002  . HPV Quadrivalent (Gardasil) 03/11/2013, 07/01/2013, 02/07/2014  . Hepatitis A (Adult) 12/29/2007, 12/29/2008  . HiB, unspecified 02/02/2002, 05/12/2002  . INFLUENZA TIV (TRI) PF (IM)(HISTORICAL) 12/29/2008, 11/15/2010  . INFLUENZA VACCINE IIV3(IM)(PF)6 MOS UP 04/06/2023  . Influenza LAIV (Nasal-Tri) HISTORICAL 12/29/2007, 01/31/2010, 02/02/2012  . Influenza Vaccine Nasal-Quad (2-28yrs)(Flumist) 03/11/2013  . Influenza Vaccine Quad(IM)6 MO-Adult(PF) 02/07/2014, 12/20/2015, 11/09/2019, 01/22/2021, 10/30/2021  . Influenza Virus Vaccine, unspecified formulation 12/20/2015  . MMR 06/01/2006, 10/15/2006  . Meningococcal B Vaccine, OMV Adjuvanted(Bexsero) 10/07/2017, 08/16/2019  . Meningococcal C Conjugate 10/01/2012  . Meningococcal Conjugate MCV4P 10/07/2017  . Pneumococcal 7-valent Conjugate Vaccine 02/02/2002,  05/12/2002, 12/06/2002  . Pneumococcal conjugate -PCV7 02/02/2002, 05/12/2002, 12/06/2002  . Poliovirus,inactivated (IPV) 02/02/2002, 05/12/2002, 06/01/2006  . TdaP 10/01/2012  . Varicella 06/01/2006, 10/15/2006    __________________________________________________________________________________________  SCREENINGS COMPLETED IN FLOWSHEETS  HARK Screening    AUDIT AUDIT - C Score (Part 1): 0  PHQ2    PHQ9 Thoughts that you would be better off dead, or of hurting yourself in some way: Several days PHQ-9 Total Score: 15  Link for Multi-language PHQ/GAD screeners: https://www.phqscreeners.com/select-screener  P4 Suicidality Screener Initial Suicide Screening Question: active vs. passive thoughts Have you had thoughts of actually hurting yourself?: (!) Yes P4 Suicidality Screener 1 - Have you ever attempted to harm yourself in the past?: (!) Yes 2 - Have you thought about how you might actually hurt yourself? If yes, how? (Use comments): (!) Yes 3 - There's a big difference between having a thought and acting on a thought. How likely do you think it is that you will act on these thoughts about hurting yourself or ending your life sometime over the next month?: (!) Somewhat likely 4 - Is there anything that would prevent or keep you from harming yourself? : Yes Suicide Risk Summary P4 - Acute Suicide Risk Level:  Higher Clarifying questions: if unclear if patient has a plan Do you live alone?: No Have you thought about taking an overdose of medication, driving your car off the road, using a gun or doing something else serious like this?: (!) Yes Do you own a gun?: No Have you been stockpiling (saving up) medications?: (!) Yes Do you feel hopeless about the future?: A little Do you feel you can resist your impulses to harm yourself?: Yes Right now, how strong is your wish to die?: Weak  GAD7   Over the last 2 weeks, how often have you been bothered by the following  problems? Feeling nervous, anxious or on edge: Nearly every day Not being able to stop or control worrying: Nearly every day Worrying too much about different things: Nearly every day Trouble relaxing: Nearly every day Being so restless that it is hard to sit still: More than half the days Becoming easily annoyed or irritable: Nearly every day Feeling afraid as if something awful might happen: Nearly every day GAD-7 Total Score: 20 How difficult have these problems made it for you to do your work, take care of things at home, or get along with other people?: Somewhat difficult  COPD Assessment    Falls Risk    .imcres

## 2023-06-05 NOTE — Progress Notes (Signed)
 [facility] Internal Medicine  CARE MANAGEMENT ENCOUNTER         Date of Service:06/05/23      Service:  Care Coordination - General Is there someone else in the room? No.  MyChart use by patient is active: yes     Purpose of contact:    Care Manager (CM) received request for assistance with coordinating in clinic risk assessment for Patient who was in clinic.   Care Manager (CM) completed the following related to the above request: Reviewed chart Identified resources  [facility] Crisis LCSW contact and hours:  Phone:  [phone] Mon-Fri 9:30 am -5:30 pm  If no answer in 5 minutes, page # [phone].  Crisis LCSW can: guide assessment and response; complete ASQ; do [place] Safety plan; and document BSA (Brief Screening Assessment) on behalf of provider.  Consulted with Dr. Tobie and Dr. Claris regarding risk assessment via Epic Secure Chat regarding risk assessment  Reviewed provider completed P4 risk assessment and PHQ9 Advised Provider needs to do clinical assessment (ASQ) for risk or can call Crisis LCSW to do risk assessment Provider completed ASQ and score was determined to be a Non-Acute Positive Per CMA note, patient reported to be stockpiling medications Provided Crisis LCSW contact information Provider updated Care Team that per clinical assessment, Provider felt it was safe for patient to return home CM recommended Provider create a plan with patient to address medication stockpiling; confirmed provider addressed this CM recommended providing patient with crisis resources and scheduling close PCP follow up  CM provided crisis resources in patient's AVS. And notified provider  CM recommended a referral for the [facility] may be appropriate     A copy of this Patient Outreach Encounter was sent to patient's Primary Care Provider

## 2023-06-08 NOTE — Progress Notes (Signed)
I saw and evaluated the patient, participating in the key portions of the service.  I reviewed the resident???s note.  I agree with the resident???s findings and plan. Kandra Graven R Albie Arizpe, MD

## 2023-06-11 ENCOUNTER — Ambulatory Visit: Admission: EM | Admit: 2023-06-11 | Discharge: 2023-06-11 | Disposition: A | Discharge status: Home / Self Care

## 2023-06-11 NOTE — ED Notes (Signed)
 Attempted to call patient x 1 on phone, no answer, unable to LVM

## 2023-06-11 NOTE — ED Notes (Signed)
 Attempted to call patient for rooming x 2, no answer on phone, unable to LVM Will d/c.

## 2023-09-11 NOTE — Telephone Encounter (Signed)
 Called all 4 numbers in chart but unavailable to reach patient or parents. I also sent the direct link to her phone as well. I did leave her a message to return call.

## 2023-11-07 ENCOUNTER — Other Ambulatory Visit: Payer: Self-pay

## 2023-11-07 ENCOUNTER — Emergency Department

## 2023-11-07 ENCOUNTER — Encounter: Payer: Self-pay | Admitting: Emergency Medicine

## 2023-11-07 ENCOUNTER — Emergency Department
Admission: EM | Admit: 2023-11-07 | Discharge: 2023-11-07 | Disposition: A | Discharge status: Home / Self Care | Attending: Emergency Medicine | Admitting: Emergency Medicine

## 2023-11-07 DIAGNOSIS — R079 Chest pain, unspecified: Secondary | ICD-10-CM | POA: Insufficient documentation

## 2023-11-07 DIAGNOSIS — R42 Dizziness and giddiness: Secondary | ICD-10-CM | POA: Insufficient documentation

## 2023-11-07 DIAGNOSIS — R110 Nausea: Secondary | ICD-10-CM | POA: Diagnosis present

## 2023-11-07 LAB — COMPREHENSIVE METABOLIC PANEL WITH GFR
ALT: 11 U/L (ref 0–44)
AST: 15 U/L (ref 15–41)
Albumin: 3.7 g/dL (ref 3.5–5.0)
Alkaline Phosphatase: 59 U/L (ref 38–126)
Anion gap: 12 (ref 5–15)
BUN: 11 mg/dL (ref 6–20)
CO2: 23 mmol/L (ref 22–32)
Calcium: 9.1 mg/dL (ref 8.9–10.3)
Chloride: 104 mmol/L (ref 98–111)
Creatinine, Ser: 0.65 mg/dL (ref 0.44–1.00)
GFR, Estimated: >60 mL/min (ref >60)
Glucose, Bld: 92 mg/dL (ref 70–99)
Potassium: 4 mmol/L (ref 3.5–5.1)
Sodium: 139 mmol/L (ref 135–145)
Total Bilirubin: 0.8 mg/dL (ref 0.0–1.2)
Total Protein: 7.1 g/dL (ref 6.5–8.1)

## 2023-11-07 LAB — CBC
HCT: 35.2 % — ABNORMAL LOW (ref 36.0–46.0)
Hemoglobin: 11.6 g/dL — ABNORMAL LOW (ref 12.0–15.0)
MCH: 31.9 pg (ref 26.0–34.0)
MCHC: 33 g/dL (ref 30.0–36.0)
MCV: 96.7 fL (ref 80.0–100.0)
Platelets: 205 K/uL (ref 150–400)
RBC: 3.64 MIL/uL — ABNORMAL LOW (ref 3.87–5.11)
RDW: 12.8 % (ref 11.5–15.5)
WBC: 7.1 K/uL (ref 4.0–10.5)
nRBC: 0 % (ref 0.0–0.2)

## 2023-11-07 LAB — URINALYSIS, ROUTINE W REFLEX MICROSCOPIC
Bacteria, UA: NONE SEEN
Bilirubin Urine: NEGATIVE
Glucose, UA: NEGATIVE mg/dL
Ketones, ur: NEGATIVE mg/dL
Leukocytes,Ua: NEGATIVE
Nitrite: NEGATIVE
Protein, ur: NEGATIVE mg/dL
RBC / HPF: 0 RBC/hpf (ref 0–5)
Specific Gravity, Urine: 1.006 (ref 1.005–1.030)
pH: 7 (ref 5.0–8.0)

## 2023-11-07 LAB — RESP PANEL BY RT-PCR (RSV, FLU A&B, COVID)  RVPGX2
Influenza A by PCR: NEGATIVE
Influenza B by PCR: NEGATIVE
Resp Syncytial Virus by PCR: NEGATIVE
SARS Coronavirus 2 by RT PCR: NEGATIVE

## 2023-11-07 LAB — POC URINE PREG, ED: Preg Test, Ur: NEGATIVE

## 2023-11-07 LAB — TROPONIN I (HIGH SENSITIVITY)
Troponin I (High Sensitivity): <2 ng/L (ref <18)
Troponin I (High Sensitivity): <2 ng/L (ref <18)

## 2023-11-07 LAB — LIPASE, BLOOD: Lipase: 27 U/L (ref 11–51)

## 2023-11-07 MED ORDER — ONDANSETRON 4 MG PO TBDP: 4.0000 mg | ORAL_TABLET | Freq: Three times a day (TID) | ORAL | 0 refills | Status: AC | PRN

## 2023-11-07 NOTE — Discharge Instructions (Signed)
 Your symptoms are likely due to a viral infection.  You can use the Zofran  as needed for nausea.  Take Tylenol  or ibuprofen as needed for body aches.  Make sure to drink plenty of fluids.  Follow-up with your primary care doctor as needed.  Return to the ER for new, worsening, or persistent severe nausea or vomiting, abdominal pain, fever, weakness, or any other new or worsening symptoms that concern you.

## 2023-11-07 NOTE — ED Triage Notes (Signed)
 Pt ambulatory to triage c/o lightheadedness, body aches, and nausea x 3 days. Pt also reports having intermittent sharp CP, denies at this time.

## 2023-11-07 NOTE — ED Notes (Signed)
 Pt ambulatory to waiting room. Pt verbalized understanding of discharge instructions.

## 2023-11-07 NOTE — ED Provider Notes (Signed)
 [facility] Provider Note    Event Date/Time   First MD Initiated Contact with Patient 11/07/23 0703     (approximate)   History   Nausea, Generalized Body Aches, and Dizziness   HPI  Judy Riddle is a 23 y.o. female with a history of anemia who presents with nausea since yesterday associated with generalized weakness, lightheadedness, and intermittent chest pain.  The patient denies fever.  She has no vomiting or diarrhea.  She denies any associated abdominal pain.  She has no cough or shortness of breath.  She denies any specific sick contacts.  I reviewed the past medical records.  The patient's most recent outpatient visit was at [facility] in April to establish care with a primary care provider.   Physical Exam   Triage Vital Signs: ED Triage Vitals  Encounter Vitals Group     BP 11/07/23 0101 105/66     Girls Systolic BP Percentile --      Girls Diastolic BP Percentile --      Boys Systolic BP Percentile --      Boys Diastolic BP Percentile --      Pulse Rate 11/07/23 0101 74     Resp 11/07/23 0101 18     Temp 11/07/23 0101 98.3 F (36.8 C)     Temp Source 11/07/23 0101 Oral     SpO2 11/07/23 0101 100 %     Weight 11/07/23 0101 114 lb (51.7 kg)     Height 11/07/23 0101 5' 3 (1.6 m)     Head Circumference --      Peak Flow --      Pain Score 11/07/23 0608 0     Pain Loc --      Pain Education --      Exclude from Growth Chart --     Most recent vital signs: Vitals:   11/07/23 0422 11/07/23 0608  BP: 104/65 109/61  Pulse: 72 65  Resp: 17 16  Temp: 98.1 F (36.7 C)   SpO2: 100% 100%     General: Awake, well-appearing, no distress.  CV:  Good peripheral perfusion.  Resp:  Normal effort.  Lungs CTAB. Abd:  Soft and nontender.  No distention.  Other:  Moist mucous membranes.  Oropharynx clear.   ED Results / Procedures / Treatments   Labs (all labs ordered are listed, but only abnormal results are displayed) Labs Reviewed   URINALYSIS, ROUTINE W REFLEX MICROSCOPIC - Abnormal; Notable for the following components:      Result Value   Color, Urine STRAW (*)    APPearance CLEAR (*)    Hgb urine dipstick SMALL (*)    All other components within normal limits  CBC - Abnormal; Notable for the following components:   RBC 3.64 (*)    Hemoglobin 11.6 (*)    HCT 35.2 (*)    All other components within normal limits  RESP PANEL BY RT-PCR (RSV, FLU A&B, COVID)  RVPGX2  LIPASE, BLOOD  COMPREHENSIVE METABOLIC PANEL WITH GFR  POC URINE PREG, ED  TROPONIN I (HIGH SENSITIVITY)  TROPONIN I (HIGH SENSITIVITY)     EKG  ED ECG REPORT I, Waylon Cassis, the attending physician, personally viewed and interpreted this ECG.  Date: 11/07/2023 EKG Time: 0112 Rate: 77 Rhythm: normal sinus rhythm QRS Axis: normal Intervals: normal ST/T Wave abnormalities: normal Narrative Interpretation: no evidence of acute ischemia    RADIOLOGY  Chest x-ray: I independently viewed and interpreted the images; there is  no focal consolidation or edema  PROCEDURES:  Critical Care performed: No  Procedures   MEDICATIONS ORDERED IN ED: Medications - No data to display   IMPRESSION / MDM / ASSESSMENT AND PLAN / ED COURSE  I reviewed the triage vital signs and the nursing notes.  23 year old female with PMH as noted above presents with nausea, body aches, and intermittent chest discomfort.  On exam the patient is well-appearing.  Vital signs are normal.  Physical exam is unremarkable for acute findings.  Differential diagnosis includes, but is not limited to, viral syndrome, dehydration, electrolyte abnormality, anemia.  CMP and lipase are unremarkable.  CBC shows no significant anemia or leukocytosis.  Troponins are negative x 2.  EKG is nonischemic.  Chest x-ray is clear.  Urinalysis is negative.  Respiratory panel is negative.  Overall presentation is consistent with a viral syndrome.  At this time, based on the negative  workup and the patient's clinical well appearance, there is no indication for further ED workup.  She is stable for discharge home.  I counseled the patient on the results of the workup.  I gave strict return precautions, and she expressed understanding.  Patient's presentation is most consistent with acute complicated illness / injury requiring diagnostic workup.     FINAL CLINICAL IMPRESSION(S) / ED DIAGNOSES   Final diagnoses:  Nausea     Rx / DC Orders   ED Discharge Orders          Ordered    ondansetron  (ZOFRAN -ODT) 4 MG disintegrating tablet  Every 8 hours PRN        11/07/23 9277             Note:  This document was prepared using Dragon voice recognition software and may include unintentional dictation errors.    Jacolyn Pae, MD 11/07/23 [phone]
